# Patient Record
Sex: Male | Born: 1995 | Race: White | Hispanic: No | Marital: Single | Smoking: Current some day smoker
Health system: Southern US, Community
[De-identification: ages and names within clinical notes are randomized; demographics above are authoritative.]

## PROBLEM LIST (undated history)

## (undated) DIAGNOSIS — I82409 Acute embolism and thrombosis of unspecified deep veins of unspecified lower extremity: Secondary | ICD-10-CM

## (undated) HISTORY — DX: Acute embolism and thrombosis of unspecified deep veins of unspecified lower extremity: I82.409

---
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
## 2018-05-16 ENCOUNTER — Emergency Department (HOSPITAL_COMMUNITY)
Admission: EM | Admit: 2018-05-16 | Discharge: 2018-05-16 | Disposition: A | Discharge status: Home / Self Care | Payer: Self-pay | Attending: Emergency Medicine | Admitting: Emergency Medicine

## 2018-05-16 ENCOUNTER — Encounter (HOSPITAL_COMMUNITY): Payer: Self-pay

## 2018-05-16 ENCOUNTER — Emergency Department (HOSPITAL_COMMUNITY): Payer: Self-pay

## 2018-05-16 ENCOUNTER — Other Ambulatory Visit: Payer: Self-pay

## 2018-05-16 DIAGNOSIS — Y929 Unspecified place or not applicable: Secondary | ICD-10-CM | POA: Insufficient documentation

## 2018-05-16 DIAGNOSIS — F17290 Nicotine dependence, other tobacco product, uncomplicated: Secondary | ICD-10-CM | POA: Insufficient documentation

## 2018-05-16 DIAGNOSIS — Y939 Activity, unspecified: Secondary | ICD-10-CM | POA: Insufficient documentation

## 2018-05-16 DIAGNOSIS — S52614A Nondisplaced fracture of right ulna styloid process, initial encounter for closed fracture: Secondary | ICD-10-CM | POA: Insufficient documentation

## 2018-05-16 DIAGNOSIS — Y999 Unspecified external cause status: Secondary | ICD-10-CM | POA: Insufficient documentation

## 2018-05-16 DIAGNOSIS — W230XXA Caught, crushed, jammed, or pinched between moving objects, initial encounter: Secondary | ICD-10-CM | POA: Insufficient documentation

## 2018-05-16 MED ORDER — NAPROXEN 500 MG PO TABS: 500.0000 mg | ORAL_TABLET | Freq: Two times a day (BID) | ORAL | 0 refills | Status: DC

## 2018-05-16 NOTE — ED Triage Notes (Signed)
Patient is AOx4 and ambulatory. Patient comes from home accompanied by mother. Patient accidentally shut door on right hand / wrist about 14 days ago, however pain has stayed somewhat the same. Patient attempted to use ice and ibproufuen for the 14 days however no decrease in pain or inflammation.

## 2018-05-16 NOTE — ED Provider Notes (Signed)
[facility]-EMERGENCY DEPT Provider Note   CSN: [id number] Arrival date & time: 05/16/18  1615     History   Chief Complaint Chief Complaint  Patient presents with  . Right Wrist Injury    HPI Ryan Drake is a 23 y.o. male with no significant past medical history who presents for evaluation of wrist pain.  Patient states he accidentally shut his wrist in a door approximately 2 weeks ago.  Patient states he has had intermittent pain to his right wrist since the incident.  Patient has been using ice and ibuprofen however states he still has intermittent pain.  Patient states he has pain when he goes to pronate and supinate at the wrist.  Patient has no pain when he flexes and extends at the right wrist.  Denies fever, chills, nausea, vomiting, numbness or tingling in his extremities, decreased range of motion, redness, warmth to his extremities.  Patient states he will have intermittent swelling to his right wrist.  Denies any additional aggravating or alleviating factors.  Denies history of IV drug use.  History obtained from patient.  No interpreter was used.  HPI  History reviewed. No pertinent past medical history.  There are no active problems to display for this patient.   History reviewed. No pertinent surgical history.      Home Medications    Prior to Admission medications   Medication Sig Start Date End Date Taking? Authorizing Provider  naproxen (NAPROSYN) 500 MG tablet Take 1 tablet (500 mg total) by mouth 2 (two) times daily. 05/16/18   Dann Ventress A, PA-C    Family History No family history on file.  Social History Social History   Tobacco Use  . Smoking status: Current Some Day Smoker    Types: Cigars  . Smokeless tobacco: Never Used  . Tobacco comment: 1-3 every other day  Substance Use Topics  . Alcohol use: No  . Drug use: Never     Allergies   Patient has no known allergies.   Review of Systems Review of Systems   Constitutional: Negative.   HENT: Negative.   Respiratory: Negative.   Cardiovascular: Negative.   Gastrointestinal: Negative.   Genitourinary: Negative.   Musculoskeletal:       Right wrist pain.  Skin: Negative.   All other systems reviewed and are negative.    Physical Exam Updated Vital Signs BP (!) 145/94 (BP Location: Left Arm)   Pulse 72   Temp 99 F (37.2 C) (Oral)   Resp 17   Ht 5\' 10"  (1.778 m)   Wt 108.4 kg   SpO2 98%   BMI 34.30 kg/m   Physical Exam Vitals signs and nursing note reviewed.  Constitutional:      General: He is not in acute distress.    Appearance: He is well-developed. He is not diaphoretic.  HENT:     Head: Atraumatic.     Nose: Nose normal.     Mouth/Throat:     Mouth: Mucous membranes are moist.     Pharynx: Oropharynx is clear.  Eyes:     Pupils: Pupils are equal, round, and reactive to light.  Neck:     Musculoskeletal: Normal range of motion and neck supple.  Cardiovascular:     Rate and Rhythm: Normal rate and regular rhythm.     Pulses: Normal pulses.     Heart sounds: Normal heart sounds. No murmur.  Pulmonary:     Effort: Pulmonary effort is normal. No  respiratory distress.     Breath sounds: Normal breath sounds.  Abdominal:     General: There is no distension.     Palpations: Abdomen is soft.     Tenderness: There is no abdominal tenderness.  Musculoskeletal: Normal range of motion.     Comments: Full range of motion bilateral upper extremities with flexion, extension.  Patient with tenderness with supination to right wrist.  No tenderness with pronation.  No bony tenderness.  No tenderness to scaphoid.  Full and equal grip strength.  Skin:    General: Skin is warm and dry.     Comments: No edema, erythema, ecchymosis or warmth to bilateral upper extremities.  Neurological:     Mental Status: He is alert.     Comments: Intact sensation to bilateral upper extremities.  5/5 strength to bilateral upper extremities.       ED Treatments / Results  Labs (all labs ordered are listed, but only abnormal results are displayed) Labs Reviewed - No data to display  EKG None  Radiology Dg Wrist Complete Right  Result Date: 05/16/2018 CLINICAL DATA:  Persistent pain after accidentally shadowing right hand/wrist in the car door 2 weeks ago. EXAM: RIGHT HAND - COMPLETE 3+ VIEW; RIGHT WRIST - COMPLETE 3+ VIEW COMPARISON:  Right wrist x-rays dated February 10, 2013. FINDINGS: Small avulsion fracture of the ulnar styloid. No additional fracture. No dislocation. Joint spaces are preserved. Bone mineralization is normal. Soft tissues are unremarkable. IMPRESSION: 1. Small avulsion fracture of the ulnar styloid. Electronically Signed   By: Obie Dredge M.D.   On: 05/16/2018 17:26   Dg Hand Complete Right  Result Date: 05/16/2018 CLINICAL DATA:  Persistent pain after accidentally shadowing right hand/wrist in the car door 2 weeks ago. EXAM: RIGHT HAND - COMPLETE 3+ VIEW; RIGHT WRIST - COMPLETE 3+ VIEW COMPARISON:  Right wrist x-rays dated February 10, 2013. FINDINGS: Small avulsion fracture of the ulnar styloid. No additional fracture. No dislocation. Joint spaces are preserved. Bone mineralization is normal. Soft tissues are unremarkable. IMPRESSION: 1. Small avulsion fracture of the ulnar styloid. Electronically Signed   By: Obie Dredge M.D.   On: 05/16/2018 17:26    Procedures Procedures (including critical care time)  Medications Ordered in ED Medications - No data to display   Initial Impression / Assessment and Plan / ED Course  I have reviewed the triage vital signs and the nursing notes.  Pertinent labs & imaging results that were available during my care of the patient were reviewed by me and considered in my medical decision making (see chart for details).  23 year old male who appears otherwise well presents for evaluation of right wrist injury.  Afebrile, nonseptic, non-ill-appearing. Symptom  onset 2 weeks ago.  Has intermittent pain and swelling to his right wrist.  Normal musculoskeletal exam.  No bony tenderness.  Neurovascularly intact.  2+ radial pulses.  No tenderness to scaphoid.  Patient with mild tenderness with supination of right wrist.  No IV drug use.  No overlying skin changes to suggest cellulitis such as warmth, erythema, swelling.  Low suspicion for septic joint, hemarthrosis, gout.  Upper extremity compartments are soft.  Low suspicion for compartment syndrome. Will obtain x-ray to rule out fracture or dislocation and reevaluate.  X-ray shows small avulsion fracture of ulna styloid.  Will place in splint and have patient follow-up with orthopedics.  Patient requesting removable velcro splint.  RICE for symptomatic management.  Patient is hemodynamically stable and appropriate for DC home at  this time.  I discussed return precautions.  Patient voiced understanding and is agreeable for follow-up.  Patient to follow-up with orthopedics for reevaluation.  Low suspicion for emergent pathology causing patient's symptoms at this time.      Final Clinical Impressions(s) / ED Diagnoses   Final diagnoses:  Closed nondisplaced fracture of styloid process of right ulna, initial encounter    ED Discharge Orders         Ordered    naproxen (NAPROSYN) 500 MG tablet  2 times daily     05/16/18 1749           Tex Conroy A, PA-C 05/16/18 1756    Geoffery Lyonselo, [place], MD 05/16/18 2251

## 2018-05-16 NOTE — Discharge Instructions (Addendum)
Your evaluated today for wrist pain.  You do have a small fracture to your ulnar styloid, which is on the right side of your wrist.  I placed you in a removable splint.  Please try to keep this on the majority of the time.  You may also ice, rest as well as take ibuprofen for your symptoms.  Please follow-up with orthopedics for reevaluation.  Their information is listed on your discharge paperwork.  You will need to call them to schedule an appointment.

## 2019-01-30 ENCOUNTER — Emergency Department (HOSPITAL_BASED_OUTPATIENT_CLINIC_OR_DEPARTMENT_OTHER): Payer: Self-pay

## 2019-01-30 ENCOUNTER — Encounter (HOSPITAL_COMMUNITY): Payer: Self-pay

## 2019-01-30 ENCOUNTER — Other Ambulatory Visit: Payer: Self-pay

## 2019-01-30 ENCOUNTER — Emergency Department (HOSPITAL_COMMUNITY)
Admission: EM | Admit: 2019-01-30 | Discharge: 2019-01-30 | Disposition: A | Discharge status: Home / Self Care | Payer: Self-pay | Attending: Emergency Medicine | Admitting: Emergency Medicine

## 2019-01-30 DIAGNOSIS — M79604 Pain in right leg: Secondary | ICD-10-CM

## 2019-01-30 DIAGNOSIS — M79609 Pain in unspecified limb: Secondary | ICD-10-CM

## 2019-01-30 DIAGNOSIS — F17290 Nicotine dependence, other tobacco product, uncomplicated: Secondary | ICD-10-CM | POA: Insufficient documentation

## 2019-01-30 DIAGNOSIS — M7989 Other specified soft tissue disorders: Secondary | ICD-10-CM

## 2019-01-30 DIAGNOSIS — I82401 Acute embolism and thrombosis of unspecified deep veins of right lower extremity: Secondary | ICD-10-CM | POA: Insufficient documentation

## 2019-01-30 LAB — BASIC METABOLIC PANEL
Anion gap: 11 (ref 5–15)
BUN: 13 mg/dL (ref 6–20)
CO2: 24 mmol/L (ref 22–32)
Calcium: 9.9 mg/dL (ref 8.9–10.3)
Chloride: 103 mmol/L (ref 98–111)
Creatinine, Ser: 1.18 mg/dL (ref 0.61–1.24)
GFR calc Af Amer: >60 mL/min (ref >60)
GFR calc non Af Amer: >60 mL/min (ref >60)
Glucose, Bld: 95 mg/dL (ref 70–99)
Potassium: 4.1 mmol/L (ref 3.5–5.1)
Sodium: 138 mmol/L (ref 135–145)

## 2019-01-30 LAB — CBC WITH DIFFERENTIAL/PLATELET
Abs Immature Granulocytes: 0.08 10*3/uL — ABNORMAL HIGH (ref 0.00–0.07)
Basophils Absolute: 0.1 10*3/uL (ref 0.0–0.1)
Basophils Relative: 1 %
Eosinophils Absolute: 0.5 10*3/uL (ref 0.0–0.5)
Eosinophils Relative: 3 %
HCT: 45.4 % (ref 39.0–52.0)
Hemoglobin: 15.4 g/dL (ref 13.0–17.0)
Immature Granulocytes: 1 %
Lymphocytes Relative: 21 %
Lymphs Abs: 2.9 10*3/uL (ref 0.7–4.0)
MCH: 30.2 pg (ref 26.0–34.0)
MCHC: 33.9 g/dL (ref 30.0–36.0)
MCV: 89 fL (ref 80.0–100.0)
Monocytes Absolute: 1.3 10*3/uL — ABNORMAL HIGH (ref 0.1–1.0)
Monocytes Relative: 10 %
Neutro Abs: 8.9 10*3/uL — ABNORMAL HIGH (ref 1.7–7.7)
Neutrophils Relative %: 64 %
Platelets: 288 10*3/uL (ref 150–400)
RBC: 5.1 MIL/uL (ref 4.22–5.81)
RDW: 12.3 % (ref 11.5–15.5)
WBC: 13.7 10*3/uL — ABNORMAL HIGH (ref 4.0–10.5)
nRBC: 0 % (ref 0.0–0.2)

## 2019-01-30 MED ORDER — RIVAROXABAN 15 MG PO TABS
15.0000 mg | ORAL_TABLET | Freq: Once | ORAL | Status: AC
  Administered 2019-01-30: 15 mg via ORAL
  Filled 2019-01-30: qty 1

## 2019-01-30 MED ORDER — RIVAROXABAN (XARELTO) EDUCATION KIT FOR DVT/PE PATIENTS
PACK | Freq: Once | Status: AC
  Administered 2019-01-30: 20:00:00
  Filled 2019-01-30: qty 1

## 2019-01-30 MED ORDER — RIVAROXABAN (XARELTO) VTE STARTER PACK (15 & 20 MG): ORAL_TABLET | ORAL | 0 refills | Status: AC

## 2019-01-30 NOTE — ED Triage Notes (Signed)
Patient c/o right leg pain x 2 weeks. Patient states the pain started at his right ankle then the calf and is now the upper leg. Patient states he had leg swelling  For a few days, but now the swelling is gone.

## 2019-01-30 NOTE — Progress Notes (Signed)
RLE venous duplex       has been completed. Preliminary results can be found under CV proc through chart review. June Leap, BS, RDMS, RVT   Positive results given to Margaux PA

## 2019-01-30 NOTE — ED Notes (Signed)
No respiratory or acute distress noted alert and oriented x 3 call light in reach water given visitor at bedside.

## 2019-01-30 NOTE — Discharge Instructions (Addendum)
You were found to have a DVT today. We have given you a dose of blood thinners in the ED and have prescribed a months worth and sent to your pharmacy. It is very important that you establish care with a PCP outpatient to have further management as you will need a new prescription every month. You may follow up with [facility]. Attached to your discharge instructions is also a number you can call to find a PCP in your area.   Return to the ED immediately for any new or worsening symptoms including chest pain or shortness of breath. Below is information on the medication you will be taking.   Information on my medicine - XARELTO (rivaroxaban)  This medication education was reviewed with me or my healthcare representative as part of my discharge preparation.  The pharmacist that spoke with me during my hospital stay was:  [place] PRESCRIBED FOR YOU? Xarelto was prescribed to treat blood clots that may have been found in the veins of your legs (deep vein thrombosis) or in your lungs (pulmonary embolism) and to reduce the risk of them occurring again.  WHAT DO YOU NEED TO KNOW ABOUT XARELTO? The starting dose is one 15 mg tablet taken TWICE daily with food for the FIRST 21 DAYS then on (enter date)  10/28  the dose is changed to one 20 mg tablet taken ONCE A DAY with your evening meal.  DO NOT stop taking Xarelto without talking to the health care provider who prescribed the medication.  Refill your prescription for 20 mg tablets before you run out.  After discharge, you should have regular check-up appointments with your healthcare provider that is prescribing your Xarelto.  In the future your dose may need to be changed if your kidney function changes by a significant amount.  WHAT DO YOU DO IF YOU MISS A DOSE? If you are taking Xarelto TWICE DAILY and you miss a dose, take it as soon as you remember. You may take two 15 mg tablets (total 30 mg) at the same time  then resume your regularly scheduled 15 mg twice daily the next day.  If you are taking Xarelto ONCE DAILY and you miss a dose, take it as soon as you remember on the same day then continue your regularly scheduled once daily regimen the next day. Do not take two doses of Xarelto at the same time.   IMPORTANT SAFETY INFORMATION Xarelto is a blood thinner medicine that can cause bleeding. You should call your healthcare provider right away if you experience any of the following: Bleeding from an injury or your nose that does not stop. Unusual colored urine (red or dark brown) or unusual colored stools (red or black). Unusual bruising for unknown reasons. A serious fall or if you hit your head (even if there is no bleeding).  Some medicines may interact with Xarelto and might increase your risk of bleeding while on Xarelto. To help avoid this, consult your healthcare provider or pharmacist prior to using any new prescription or non-prescription medications, including herbals, vitamins, non-steroidal anti-inflammatory drugs (NSAIDs) and supplements.  This website has more information on Xarelto: [email or web address]

## 2019-01-30 NOTE — ED Provider Notes (Signed)
[facility] DEPT Provider Note   CSN: [id number] Arrival date & time: 01/30/19  1235     History   Chief Complaint Chief Complaint  Patient presents with   Leg Pain    HPI Ryan Drake is a 23 y.o. male who presents to the ED today complaining of sudden onset, constant, achy, right calf pain x 2 weeks.  Reports he was sitting on the couch when he felt an immediate pain in his calf.  He states that it has slowly moved up and is now behind his right knee.  States he did notice some leg swelling that has since improved.  He has been taking ibuprofen and Tylenol for the pain.  History of DVT or PE.  Patient states that he does have a family history of DVT due to a gene disorder.  Recent prolonged travel or immobilization.  No hemoptysis.  No active malignancy.  Denies fever, chills, chest pains, shortness of breath, any other associated symptoms.  No injury to the leg prior to the pain beginning.       History reviewed. No pertinent past medical history.  There are no active problems to display for this patient.   History reviewed. No pertinent surgical history.      Home Medications    Prior to Admission medications   Medication Sig Start Date End Date Taking? Authorizing Provider  naproxen (NAPROSYN) 500 MG tablet Take 1 tablet (500 mg total) by mouth 2 (two) times daily. Patient not taking: Reported on 01/30/2019 05/16/18   Henderly, Britni A, PA-C  Rivaroxaban 15 & 20 MG TBPK Follow package directions: Take one 37m tablet by mouth twice a day. On day 22, switch to one 235mtablet once a day. Take with food. 01/30/19   VeEustaquio MaizePA-C    Family History Family History  Problem Relation Age of Onset   Healthy Mother    Healthy Father     Social History Social History   Tobacco Use   Smoking status: Current Some Day Smoker    Types: Cigars   Smokeless tobacco: Never Used   Tobacco comment: 1-3 every other day  Substance Use  Topics   Alcohol use: No   Drug use: Never     Allergies   Patient has no known allergies.   Review of Systems Review of Systems  Constitutional: Negative for chills and fever.  HENT: Negative for congestion.   Eyes: Negative for visual disturbance.  Respiratory: Negative for cough and shortness of breath.   Cardiovascular: Negative for chest pain.  Gastrointestinal: Negative for abdominal pain, nausea and vomiting.  Genitourinary: Negative for difficulty urinating.  Musculoskeletal: Positive for arthralgias.  Skin: Negative for color change.  Neurological: Negative for weakness and numbness.     Physical Exam Updated Vital Signs BP (!) 152/83 (BP Location: Right Arm)    Pulse (!) 108    Temp 98.5 F (36.9 C) (Oral)    Resp 16    Ht '5\' 11"'  (1.803 m)    Wt 119.2 kg    SpO2 98%    BMI 36.65 kg/m   Physical Exam Vitals signs and nursing note reviewed.  Constitutional:      Appearance: He is obese. He is not ill-appearing.  HENT:     Head: Normocephalic and atraumatic.  Eyes:     Conjunctiva/sclera: Conjunctivae normal.  Neck:     Musculoskeletal: Neck supple.  Cardiovascular:     Rate and Rhythm: Normal rate and  regular rhythm.     Pulses: Normal pulses.  Pulmonary:     Effort: Pulmonary effort is normal.     Breath sounds: Normal breath sounds. No wheezing, rhonchi or rales.  Abdominal:     Palpations: Abdomen is soft.     Tenderness: There is no abdominal tenderness. There is no guarding or rebound.  Musculoskeletal:     Comments: No obvious swelling to right leg compared to left. + TTP to popliteal area laterally. No tenderness to ankle, achilles tendon, or bony portion of the knee. ROM intact throughout hip, knee, and ankle. Strength 5/5 bilaterally. Sensation intact throughout. 2+ PT and DP pulses.   Skin:    General: Skin is warm and dry.  Neurological:     Mental Status: He is alert.      ED Treatments / Results  Labs (all labs ordered are listed,  but only abnormal results are displayed) Labs Reviewed  CBC WITH DIFFERENTIAL/PLATELET - Abnormal; Notable for the following components:      Result Value   WBC 13.7 (*)    Neutro Abs 8.9 (*)    Monocytes Absolute 1.3 (*)    Abs Immature Granulocytes 0.08 (*)    All other components within normal limits  BASIC METABOLIC PANEL    EKG None  Radiology Vas [place] Lower Extremity Venous (dvt) (only Mc & Wl 7a-7p)  Result Date: 01/30/2019  Lower Venous Study Indications: Pain, and Swelling.  Comparison Study: no prior Performing Technologist: June Leap RDMS, RVT  Examination Guidelines: A complete evaluation includes B-mode imaging, spectral Doppler, color Doppler, and power Doppler as needed of all accessible portions of each vessel. Bilateral testing is considered an integral part of a complete examination. Limited examinations for reoccurring indications may be performed as noted.  +---------+---------------+---------+-----------+----------+--------------+  RIGHT     Compressibility Phasicity Spontaneity Properties Thrombus Aging  +---------+---------------+---------+-----------+----------+--------------+  CFV       Full            Yes       Yes                                    +---------+---------------+---------+-----------+----------+--------------+  SFJ       Full                                                             +---------+---------------+---------+-----------+----------+--------------+  FV Prox   Full                                                             +---------+---------------+---------+-----------+----------+--------------+  FV Mid    Partial                   Yes                                    +---------+---------------+---------+-----------+----------+--------------+  FV Distal None                                                             +---------+---------------+---------+-----------+----------+--------------+  PFV       Full                                                              +---------+---------------+---------+-----------+----------+--------------+  POP       None            No        No                                     +---------+---------------+---------+-----------+----------+--------------+  PTV       None                                                             +---------+---------------+---------+-----------+----------+--------------+  PERO      None                                                             +---------+---------------+---------+-----------+----------+--------------+  Gastroc   None                                                             +---------+---------------+---------+-----------+----------+--------------+     Summary: Right: Findings consistent with acute deep vein thrombosis involving the right mid to distal femoral vein, right popliteal vein, right posterior tibial veins, right peroneal veins, and right gastrocnemius veins.  *See table(s) above for measurements and observations.    Preliminary     Procedures Procedures (including critical care time)  Medications Ordered in ED Medications  rivaroxaban [place] Reasons) Education Kit for DVT/PE patients ( Does not apply Given 01/30/19 2006)  Rivaroxaban (XARELTO) tablet 15 mg (15 mg Oral Given 01/30/19 2004)     Initial Impression / Assessment and Plan / ED Course  I have reviewed the triage vital signs and the nursing notes.  Pertinent labs & imaging results that were available during my care of the patient were reviewed by me and considered in my medical decision making (see chart for details).    23 year old male who presents to the ED today complaining of sudden onset right calf pain x2 weeks ago.  He does have family history of blood clots from a gene disorder.  He has not been tested for this gene.  No provoking risk factors today.  Patient denying any chest pain or shortness of breath.  Will obtain ultrasound at this time.  Was initially tachycardic on exam  at 108 but it has decreased while in the ER. Most recent pulse 84.   Ultrasound positive for DVT.  Will obtain baseline blood work at this time prior to starting Xarelto.  Given patient without any chest pain or  shortness of breath.  Do not feel he needs CTA at this time.    Lab work unremarkable today.  Have given first dose of Xarelto in the ED.  Pharmacist has spoken to patient about side effects. Stable for discharge at this time.    Final Clinical Impressions(s) / ED Diagnoses   Final diagnoses:  Right leg pain  Acute deep vein thrombosis (DVT) of right lower extremity, unspecified vein [facility])    ED Discharge Orders         Ordered    Rivaroxaban 15 & 20 MG TBPK     01/30/19 2011           Eustaquio Maize, PA-C 01/30/19 2141    Lucrezia Starch, MD 01/31/19 [phone]

## 2019-05-25 ENCOUNTER — Telehealth: Payer: Self-pay | Admitting: Hematology and Oncology

## 2019-05-25 NOTE — Telephone Encounter (Signed)
A new hem appt has been scheduled for the pt to see Dr. Leonides Schanz on 2/8 at 2pm. Pt aware to arrive 15 minutes early.

## 2019-06-03 NOTE — Progress Notes (Signed)
[facility] [phone]   Fax:([phone]  INITIAL CONSULT NOTE  Patient Care Team: Patient, No Pcp Per as PCP - General (General Practice)  Hematological/Oncological History  # Extensive Right Lower Extremity DVT #Family History of Coagulable State 1) 01/30/2019: patient presented to the ED with right calf pain x 2 weeks. LE US showed acute deep vein thrombosis involving the right mid to distal femoral vein, right popliteal vein, right posterior tibial veins, right peroneal veins, and right gastrocnemius veins. Started on Xarelto therapy.  2) 06/04/2019: establish care with Dr. Leonides Schanz   CHIEF COMPLAINTS/PURPOSE OF CONSULTATION:  Extensive Right Lower Extremity DVT  HISTORY OF PRESENTING ILLNESS:  Ryan Drake 24 y.o. male with medical history significant for a right lower extremity DVT who presents for evaluation and management.   On review of the previous records Ryan Drake presented to the emergency department on 01/30/2019 with 2 weeks of right calf pain. He underwent a lower extremity US which revealed acute deep vein thrombosis involving the right mid to distal femoral vein, right popliteal vein, right posterior tibial veins, right peroneal veins, and right gastrocnemius veins. He was started on Xarelto therapy and discharged. He was subsequently referred to hematology for hypercoagulation workup and anticoagulation management.   On exam today Ryan Drake notes that he feels well.  He reports that his symptoms of leg pain and swelling have dissipated in the 2 weeks following his starting of anticoagulation therapy.  He reports that he has not had any issues previously with blood clots in his past.  He does note that a maternal grandmother was noted to have "factor 4 something" and that she was of European descent.  He denied having any surgery, trauma, or prolonged travel prior to developing his blood clot.  He is currently a smoker but smokes less than 1  pack/day.  He does report that he was sitting at home at the time of the clot when he felt the pain in the back of his right calf.  He reports that he has no other relevant medical history.  On discussion of his Xarelto therapy he notes that he has not had any issues with bleeding, bruising, or dark stools.  He has not noticed any side effects since starting the medication.  Otherwise he has had no nausea, vomiting, diarrhea, fevers, chills, sweats.  A full 10 point ROS is listed below.  MEDICAL HISTORY:  Past Medical History:  Diagnosis Date   DVT (deep venous thrombosis) (HCC)     SURGICAL HISTORY: History reviewed. No pertinent surgical history.  SOCIAL HISTORY: Social History   Socioeconomic History   Marital status: Single    Spouse name: Not on file   Number of children: 0   Years of education: Not on file   Highest education level: Not on file  Occupational History   Not on file  Tobacco Use   Smoking status: Current Some Day Smoker    Types: Cigars   Smokeless tobacco: Never Used   Tobacco comment: 1-3 every other day  Substance and Sexual Activity   Alcohol use: No   Drug use: Never   Sexual activity: Yes    Birth control/protection: Condom  Other Topics Concern   Not on file  Social History Narrative   Not on file   Social Determinants of Health   Financial Resource Strain:    Difficulty of Paying Living Expenses: Not on file  Food Insecurity:    Worried About Running Out  of Food in the Last Year: Not on file   Ran Out of Food in the Last Year: Not on file  Transportation Needs:    Lack of Transportation (Medical): Not on file   Lack of Transportation (Non-Medical): Not on file  Physical Activity:    Days of Exercise per Week: Not on file   Minutes of Exercise per Session: Not on file  Stress:    Feeling of Stress : Not on file  Social Connections:    Frequency of Communication with Friends and Family: Not on file   Frequency  of Social Gatherings with Friends and Family: Not on file   Attends Religious Services: Not on file   Active Member of Clubs or Organizations: Not on file   Attends Banker Meetings: Not on file   Marital Status: Not on file  Intimate Partner Violence:    Fear of Current or Ex-Partner: Not on file   Emotionally Abused: Not on file   Physically Abused: Not on file   Sexually Abused: Not on file    FAMILY HISTORY: Family History  Problem Relation Age of Onset   Healthy Mother    Healthy Father     ALLERGIES:  has No Known Allergies.  MEDICATIONS:  Current Outpatient Medications  Medication Sig Dispense Refill   Rivaroxaban 15 & 20 MG TBPK Follow package directions: Take one 15mg  tablet by mouth twice a day. On day 22, switch to one 20mg  tablet once a day. Take with food. 51 each 0   No current facility-administered medications for this visit.    REVIEW OF SYSTEMS:   Constitutional: ( - ) fevers, ( - )  chills , ( - ) night sweats Eyes: ( - ) blurriness of vision, ( - ) double vision, ( - ) watery eyes Ears, nose, mouth, throat, and face: ( - ) mucositis, ( - ) sore throat Respiratory: ( - ) cough, ( - ) dyspnea, ( - ) wheezes Cardiovascular: ( - ) palpitation, ( - ) chest discomfort, ( - ) lower extremity swelling Gastrointestinal:  ( - ) nausea, ( - ) heartburn, ( - ) change in bowel habits Skin: ( - ) abnormal skin rashes Lymphatics: ( - ) new lymphadenopathy, ( - ) easy bruising Neurological: ( - ) numbness, ( - ) tingling, ( - ) new weaknesses Behavioral/Psych: ( - ) mood change, ( - ) new changes  All other systems were reviewed with the patient and are negative.  PHYSICAL EXAMINATION: ECOG PERFORMANCE STATUS: 0 - Asymptomatic  Vitals:   06/04/19 1341  BP: (!) 144/83  Pulse: 80  Resp: 20  Temp: 98 F (36.7 C)  SpO2: 100%   Filed Weights   06/04/19 1341  Weight: 289 lb 3.2 oz (131.2 kg)    GENERAL: well appearing young African  American male in NAD  SKIN: skin color, texture, turgor are normal, no rashes or significant lesions EYES: conjunctiva are pink and non-injected, sclera clear LUNGS: clear to auscultation and percussion with normal breathing effort HEART: regular rate & rhythm and no murmurs and no lower extremity edema ABDOMEN: soft, non-tender, non-distended, normal bowel sounds Musculoskeletal: no cyanosis of digits and no clubbing  PSYCH: alert & oriented x 3, fluent speech NEURO: no focal motor/sensory deficits  LABORATORY DATA:  I have reviewed the data as listed CBC Latest Ref Rng & Units 06/04/2019 01/30/2019  WBC 4.0 - 10.5 K/uL 8.8 13.7(H)  Hemoglobin 13.0 - 17.0 g/dL 08/02/2019 15.4  Hematocrit 39.0 - 52.0 % 46.7 45.4  Platelets 150 - 400 K/uL 307 288    CMP Latest Ref Rng & Units 06/04/2019 01/30/2019  Glucose 70 - 99 mg/dL 91 95  BUN 6 - 20 mg/dL 11 13  Creatinine 0.09 - 1.24 mg/dL 3.81 8.29  Sodium 937 - 145 mmol/L 142 138  Potassium 3.5 - 5.1 mmol/L 4.1 4.1  Chloride 98 - 111 mmol/L 106 103  CO2 22 - 32 mmol/L 28 24  Calcium 8.9 - 10.3 mg/dL 9.9 9.9  Total Protein 6.5 - 8.1 g/dL 7.0 -  Total Bilirubin 0.3 - 1.2 mg/dL 0.5 -  Alkaline Phos 38 - 126 U/L 81 -  AST 15 - 41 U/L 15 -  ALT 0 - 44 U/L 27 -   PATHOLOGY: None relevant to review.   RADIOGRAPHIC STUDIES: None relevant to review.  No results found.  ASSESSMENT & PLAN Ryan Drake 24 y.o. male with medical history significant for a right lower extremity DVT who presents for evaluation and management.  On examination today Ryan Drake has no signs or symptoms of residual DVT.  Additionally he has no signs concerning for pulmonary embolism.  He is tolerating his Xarelto therapy well without any bleeding, bruising, or dark stools.  He does report having a strong family history of clotting disorder with his maternal grandmother having factor V Leiden.  He personally has had no other blood clots.  Unfortunately after review of the history  around the time of the clot the patient had no clear provoking etiology.  As such the recommendation is for lifelong anticoagulation in the absence of any contraindications.  Today we discussed with Ryan Drake lifelong anticoagulation therapy and how he would like to proceed with his treatment.  I note that we could continue his Xarelto for an additional 3 months time (for a total of 17-months) and then consider decreasing to a maintenance dose of 10 mg.  Additionally today we will do a hypercoagulation work-up to include factor V Leiden, prothrombin gene mutation, and antiphospholipid antibodies.  We will hold on lupus anticoagulant panel as this would likely be a false positive in the setting of the patient's Xarelto use.  Ryan Drake voiced his understanding of our findings and was agreeable to return for further discussion in 3 months time.  #Extensive Right Lower Extremity DVT #Family History of Hypercoagulable State -- today will order CBC, CMP for baseline labs to assure no bleeding and good renal/hepatic function -- hypercoagulation studies with FVL,Prothrombin gene mutation, and APS antibodies (hold on lupus anticoagulant panel given patient is on Xarelto). Of note, only APS positivity would change our management strategy Dewayne Hatch Intern Med. 2019 Nov 19;171(10):[id number]) -- given the unprovoked nature of this patient's DVT would recommend indefinite anticoagulation.  --RTC in 3 months to discuss decreased Xarelto dosage to 10mg  daily as a maintenance dose.   Orders Placed This Encounter  Procedures   CBC with Differential (Cancer Center Only)    Standing Status:   Future    Number of Occurrences:   1    Standing Expiration Date:   06/03/2020   CMP (Cancer Center only)    Standing Status:   Future    Number of Occurrences:   1    Standing Expiration Date:   06/03/2020   Beta-2-glycoprotein i abs, IgG/M/A    Standing Status:   Future    Number of Occurrences:   1    Standing Expiration  Date:   06/03/2020  Cardiolipin antibodies, IgG, IgM, IgA*    Standing Status:   Future    Number of Occurrences:   1    Standing Expiration Date:   06/03/2020   Factor 5 Leiden*    Standing Status:   Future    Number of Occurrences:   1    Standing Expiration Date:   06/03/2020   Prothrombin gene mutation*    Standing Status:   Future    Number of Occurrences:   1    Standing Expiration Date:   06/03/2020   All questions were answered. The patient knows to call the clinic with any problems, questions or concerns.  A total of more than 45 minutes were spent on this encounter and over half of that time was spent on counseling and coordination of care as outlined above.   Ledell Peoples, MD Department of Hematology/Oncology [place] [facility] Phone: [phone] Pager: [phone] Email: [email or web address]  06/04/2019 5:32 PM   Literature Support:   Lum Keas, Sez-Comet L, Prez-Conesa M, Vidal X, Riera-Mestre A, Castro-Salom A, Cuquet-Pedragosa J, Ortiz-Santamaria V, Mauri-Plana M, Sol C, Corts-Hernndez J. Rivaroxaban Versus Vitamin K Antagonist in Antiphospholipid Syndrome: A Randomized Noninferiority Trial. Ann Intern Med. 2019 Nov 19;171(10):685-694. doi: 68.0321/Y24-8250. Epub 2019 Oct 15. PMID: 03704888. --Rivaroxaban did not show noninferiority to dose-adjusted VKAs for thrombotic APS and, in fact, showed a non-statistically significant near doubling of the risk for recurrent thrombosis.

## 2019-06-04 ENCOUNTER — Encounter: Payer: Self-pay | Admitting: Hematology and Oncology

## 2019-06-04 ENCOUNTER — Inpatient Hospital Stay: Payer: Self-pay | Attending: Hematology and Oncology | Admitting: Hematology and Oncology

## 2019-06-04 ENCOUNTER — Inpatient Hospital Stay: Payer: Self-pay

## 2019-06-04 ENCOUNTER — Other Ambulatory Visit: Payer: Self-pay

## 2019-06-04 VITALS — BP 144/83 | HR 80 | Temp 98.0°F | Resp 20 | Ht 71.0 in | Wt 289.2 lb

## 2019-06-04 DIAGNOSIS — Z86718 Personal history of other venous thrombosis and embolism: Secondary | ICD-10-CM | POA: Insufficient documentation

## 2019-06-04 DIAGNOSIS — I82461 Acute embolism and thrombosis of right calf muscular vein: Secondary | ICD-10-CM

## 2019-06-04 DIAGNOSIS — F17210 Nicotine dependence, cigarettes, uncomplicated: Secondary | ICD-10-CM | POA: Insufficient documentation

## 2019-06-04 DIAGNOSIS — Z7901 Long term (current) use of anticoagulants: Secondary | ICD-10-CM | POA: Insufficient documentation

## 2019-06-04 DIAGNOSIS — M79661 Pain in right lower leg: Secondary | ICD-10-CM | POA: Insufficient documentation

## 2019-06-04 LAB — CBC WITH DIFFERENTIAL (CANCER CENTER ONLY)
Abs Immature Granulocytes: 0.05 10*3/uL (ref 0.00–0.07)
Basophils Absolute: 0.1 10*3/uL (ref 0.0–0.1)
Basophils Relative: 1 %
Eosinophils Absolute: 0.2 10*3/uL (ref 0.0–0.5)
Eosinophils Relative: 2 %
HCT: 46.7 % (ref 39.0–52.0)
Hemoglobin: 15.8 g/dL (ref 13.0–17.0)
Immature Granulocytes: 1 %
Lymphocytes Relative: 20 %
Lymphs Abs: 1.8 10*3/uL (ref 0.7–4.0)
MCH: 29.5 pg (ref 26.0–34.0)
MCHC: 33.8 g/dL (ref 30.0–36.0)
MCV: 87.1 fL (ref 80.0–100.0)
Monocytes Absolute: 0.6 10*3/uL (ref 0.1–1.0)
Monocytes Relative: 7 %
Neutro Abs: 6.2 10*3/uL (ref 1.7–7.7)
Neutrophils Relative %: 69 %
Platelet Count: 307 10*3/uL (ref 150–400)
RBC: 5.36 MIL/uL (ref 4.22–5.81)
RDW: 13.2 % (ref 11.5–15.5)
WBC Count: 8.8 10*3/uL (ref 4.0–10.5)
nRBC: 0 % (ref 0.0–0.2)

## 2019-06-04 LAB — CMP (CANCER CENTER ONLY)
ALT: 27 U/L (ref 0–44)
AST: 15 U/L (ref 15–41)
Albumin: 4.2 g/dL (ref 3.5–5.0)
Alkaline Phosphatase: 81 U/L (ref 38–126)
Anion gap: 8 (ref 5–15)
BUN: 11 mg/dL (ref 6–20)
CO2: 28 mmol/L (ref 22–32)
Calcium: 9.9 mg/dL (ref 8.9–10.3)
Chloride: 106 mmol/L (ref 98–111)
Creatinine: 1.22 mg/dL (ref 0.61–1.24)
GFR, Est AFR Am: >60 mL/min (ref >60)
GFR, Estimated: >60 mL/min (ref >60)
Glucose, Bld: 91 mg/dL (ref 70–99)
Potassium: 4.1 mmol/L (ref 3.5–5.1)
Sodium: 142 mmol/L (ref 135–145)
Total Bilirubin: 0.5 mg/dL (ref 0.3–1.2)
Total Protein: 7 g/dL (ref 6.5–8.1)

## 2019-06-05 ENCOUNTER — Telehealth: Payer: Self-pay | Admitting: Hematology and Oncology

## 2019-06-05 NOTE — Telephone Encounter (Signed)
Scheduled per los. Called and spoke with patient. Confirmed appt 

## 2019-06-06 LAB — BETA-2-GLYCOPROTEIN I ABS, IGG/M/A
Beta-2 Glyco I IgG: <9 GPI IgG units (ref 0–20)
Beta-2-Glycoprotein I IgA: <9 GPI IgA units (ref 0–25)
Beta-2-Glycoprotein I IgM: <9 GPI IgM units (ref 0–32)

## 2019-06-07 LAB — CARDIOLIPIN ANTIBODIES, IGG, IGM, IGA
Anticardiolipin IgA: <9 APL U/mL (ref 0–11)
Anticardiolipin IgG: <9 GPL U/mL (ref 0–14)
Anticardiolipin IgM: <9 MPL U/mL (ref 0–12)

## 2019-06-08 LAB — PROTHROMBIN GENE MUTATION

## 2019-06-11 LAB — FACTOR 5 LEIDEN

## 2019-06-13 ENCOUNTER — Telehealth: Payer: Self-pay

## 2019-06-13 NOTE — Telephone Encounter (Signed)
-----   Message from Kyra Searles, RN sent at 06/13/2019  9:53 AM EST -----  ----- Message ----- From: Jaci Standard, MD Sent: 06/13/2019   8:33 AM EST To: Kyra Searles, RN  Please call Mr. 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 Our recommendation is for him to continue his Xarelto and we will see him back in 3 months time to discuss lowering to maintenance dose.   Azucena Freed  ----- Message ----- From: Leory Plowman, Lab In [place] Sent: 06/04/2019   3:09 PM EST To: Jaci Standard, MD

## 2019-06-13 NOTE — Telephone Encounter (Signed)
TCT patient regarding his test results but no response. Left VM message for patient to call nurse back.

## 2019-06-18 NOTE — Telephone Encounter (Signed)
TCT patient regarding lab results. No answer but was able to leave vm message for pt to call back at his convenience @ [phone]

## 2019-08-30 NOTE — Progress Notes (Deleted)
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 LE US showed acute deep vein thrombosis involving the right mid to distal femoral vein, right popliteal vein, right posterior tibial veins, right peroneal veins, and right gastrocnemius veins. Started on Xarelto therapy.  2) 06/04/2019: establish care with Dr. Leonides Schanz. 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 male with medical history significant for unprovoked right lower extremity DVT who presents for a follow up visit. The patient's last visit was on 06/04/2019 at which time he established care. In the interim since the last visit ***  On exam today ***  MEDICAL HISTORY:  Past Medical History:  Diagnosis Date  . DVT (deep venous thrombosis) (HCC)     SURGICAL HISTORY: No past surgical history on file.    ALLERGIES:  has No Known Allergies.  MEDICATIONS:  Current Outpatient Medications  Medication Sig Dispense Refill  . Rivaroxaban 15 & 20 MG TBPK Follow package directions: Take one 15mg  tablet by mouth twice a day. On day 22, switch to one 20mg  tablet once a day. Take with food. 51 each 0   No current facility-administered medications for this visit.    REVIEW OF SYSTEMS:   Constitutional: ( - ) fevers, ( - )  chills , ( - ) night sweats Eyes: ( - ) blurriness of vision, ( - ) double vision, ( - ) watery eyes Ears, nose, mouth, throat, and face: ( - ) mucositis, ( - ) sore throat Respiratory: ( - ) cough, ( - ) dyspnea, ( - ) wheezes Cardiovascular: ( - ) palpitation, ( - ) chest discomfort, ( - ) lower extremity  swelling Gastrointestinal:  ( - ) nausea, ( - ) heartburn, ( - ) change in bowel habits Skin: ( - ) abnormal skin rashes Lymphatics: ( - ) new lymphadenopathy, ( - ) easy bruising Neurological: ( - ) numbness, ( - ) tingling, ( - ) new weaknesses Behavioral/Psych: ( - ) mood change, ( - ) new changes  All other systems were reviewed with the patient and are negative.  PHYSICAL EXAMINATION: ECOG PERFORMANCE STATUS: {CHL ONC ECOG PS:[phone]}  There were no vitals filed for this visit. There were no vitals filed for this visit.  GENERAL: alert, no distress and comfortable SKIN: skin color, texture, turgor are normal, no rashes or significant lesions EYES: conjunctiva are pink and non-injected, sclera clear OROPHARYNX: no exudate, no erythema; lips, buccal mucosa, and tongue normal  NECK: supple, non-tender LYMPH:  no palpable lymphadenopathy in the cervical, axillary or inguinal LUNGS: clear to auscultation and percussion with normal breathing effort HEART: regular rate & rhythm and no murmurs and no lower extremity edema ABDOMEN: soft, non-tender, non-distended, normal bowel sounds Musculoskeletal: no cyanosis of digits and no clubbing  PSYCH: alert & oriented x 3, fluent speech NEURO: no focal motor/sensory deficits  LABORATORY DATA:  I have reviewed the data as listed CBC Latest Ref Rng & Units 06/04/2019 01/30/2019  WBC 4.0 - 10.5 K/uL 8.8 13.7(H)  Hemoglobin 13.0 - 17.0 g/dL 08/02/2019 04/01/2019  Hematocrit 73.4 - 52.0 % 46.7 45.4  Platelets 150 - 400 K/uL  307 288    CMP Latest Ref Rng & Units 06/04/2019 01/30/2019  Glucose 70 - 99 mg/dL 91 95  BUN 6 - 20 mg/dL 11 13  Creatinine 0.61 - 1.24 mg/dL 1.22 1.18  Sodium 135 - 145 mmol/L 142 138  Potassium 3.5 - 5.1 mmol/L 4.1 4.1  Chloride 98 - 111 mmol/L 106 103  CO2 22 - 32 mmol/L 28 24  Calcium 8.9 - 10.3 mg/dL 9.9 9.9  Total Protein 6.5 - 8.1 g/dL 7.0 -  Total Bilirubin 0.3 - 1.2 mg/dL 0.5 -  Alkaline Phos 38 - 126 U/L 81 -  AST 15  - 41 U/L 15 -  ALT 0 - 44 U/L 27 -    BLOOD FILM: *** Review of the peripheral blood smear showed normal appearing white cells with neutrophils that were appropriately lobated and granulated. There was no predominance of bi-lobed or hyper-segmented neutrophils appreciated. No Dohle bodies were noted. There was no left shifting, immature forms or blasts noted. Lymphocytes remain normal in size without any predominance of large granular lymphocytes. Red cells show no anisopoikilocytosis, macrocytes , microcytes or polychromasia. There were no schistocytes, target cells, echinocytes, acanthocytes, dacrocytes, or stomatocytes.There was no rouleaux formation, nucleated red cells, or intra-cellular inclusions noted. The platelets are normal in size, shape, and color without any clumping evident.  RADIOGRAPHIC STUDIES: I have personally reviewed the radiological images as listed and agreed with the findings in the report. No results found.  ASSESSMENT & PLAN Ryan Drake 24 y.o. male with medical history significant for unprovoked right lower extremity DVT who presents for a follow up visit. 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 Of note, only APS positivity would change our management strategy Lelon Frohlich Intern Med. 2019 Nov 19;171(10):685-694) -- given the unprovoked nature of this patient's DVT would recommend indefinite anticoagulation.  --RTC in 3 months to discuss decreased Xarelto dosage to 10mg  daily as a maintenance dose.   No orders of the defined types were placed in this encounter.   All questions were answered. The patient knows to call the clinic with  any problems, questions or concerns.  A total of more than {CHL ONC TIME VISIT - ALPFX:[id number]} were spent on this encounter and over half of that time was spent on counseling and coordination of care as outlined above.   Ledell Peoples, MD Department of Hematology/Oncology [place] at [facility] Phone: [phone] Pager: [phone] Email: [email or web address]  08/30/2019 11:36 AM

## 2019-08-31 ENCOUNTER — Inpatient Hospital Stay: Payer: Self-pay | Attending: Hematology and Oncology | Admitting: Hematology and Oncology

## 2019-08-31 ENCOUNTER — Inpatient Hospital Stay: Payer: Self-pay

## 2020-06-03 IMAGING — DX DG WRIST COMPLETE 3+V*R*
4 series · 4 of 4 positions shown · non-contrast
Comparison: Right wrist x-rays dated February 10, 2013.

CLINICAL DATA: Persistent pain after accidentally shadowing right
hand/wrist in the car door 2 weeks ago.

EXAM:
RIGHT HAND - COMPLETE 3+ VIEW; RIGHT WRIST - COMPLETE 3+ VIEW

[wrist ap (1 of 2)]
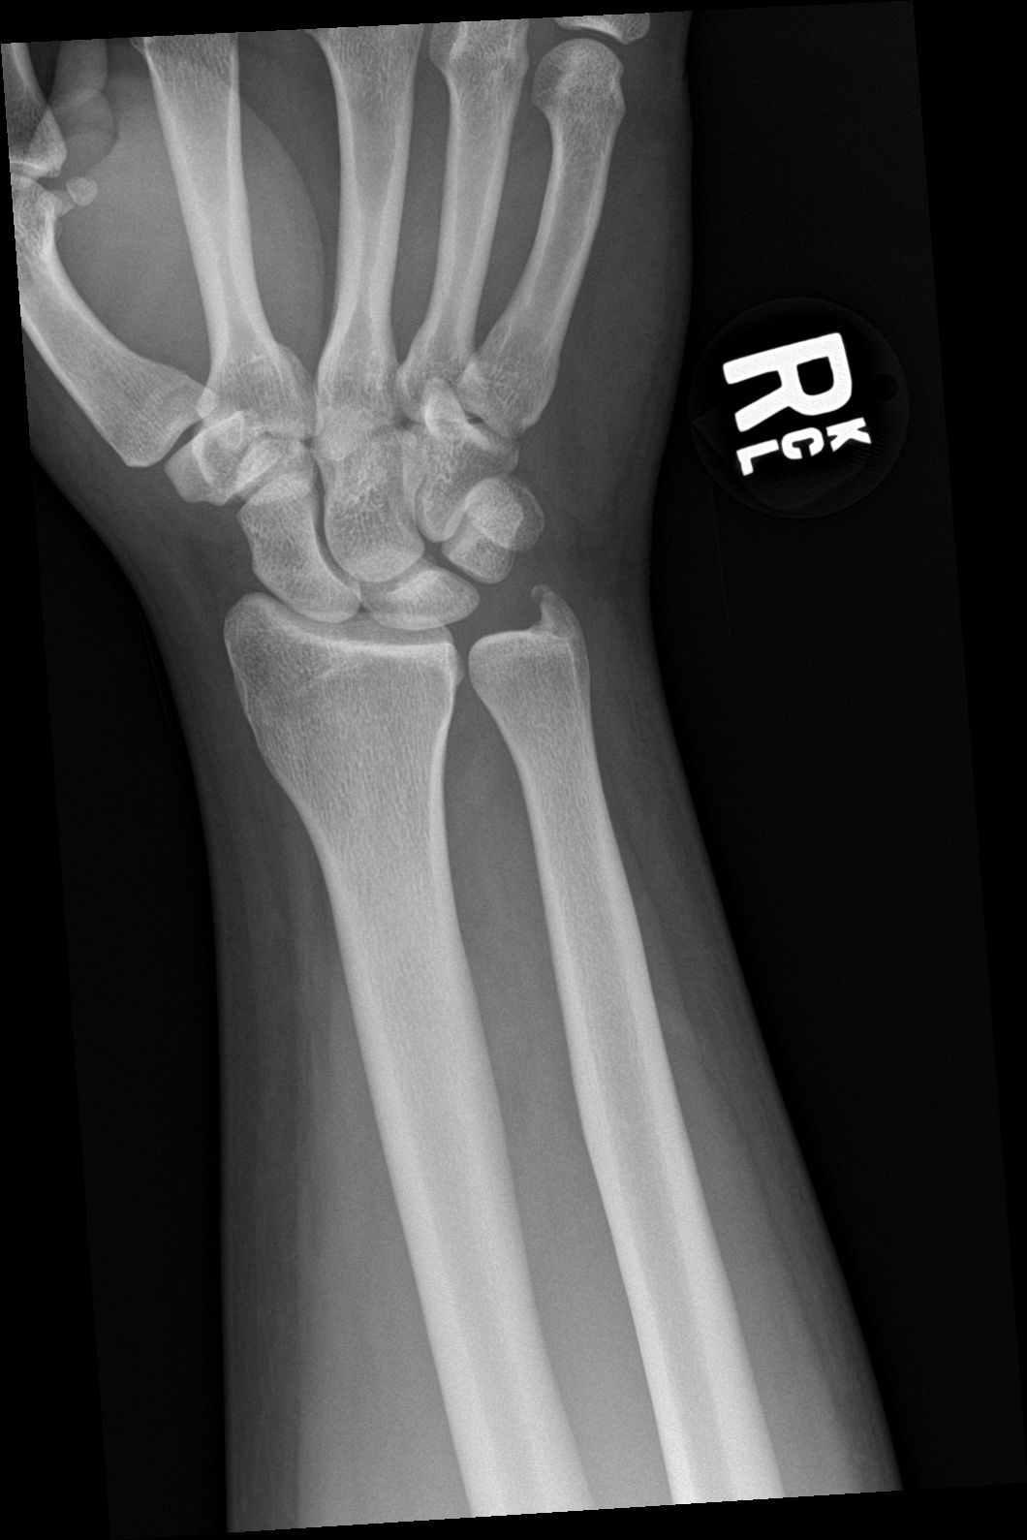

[wrist obl]
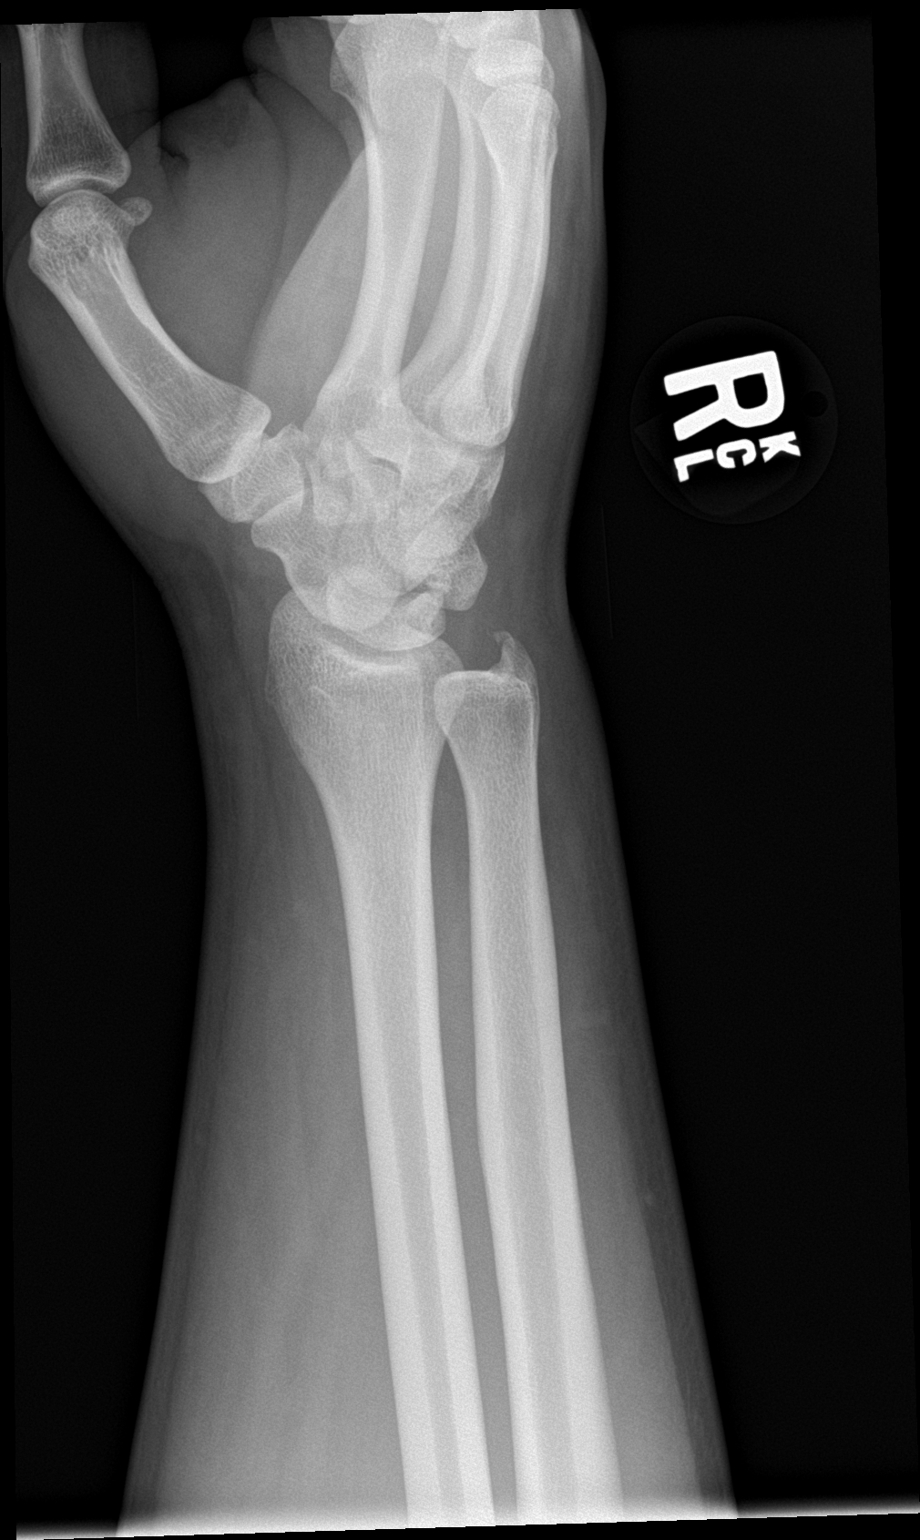

[wrist lat]
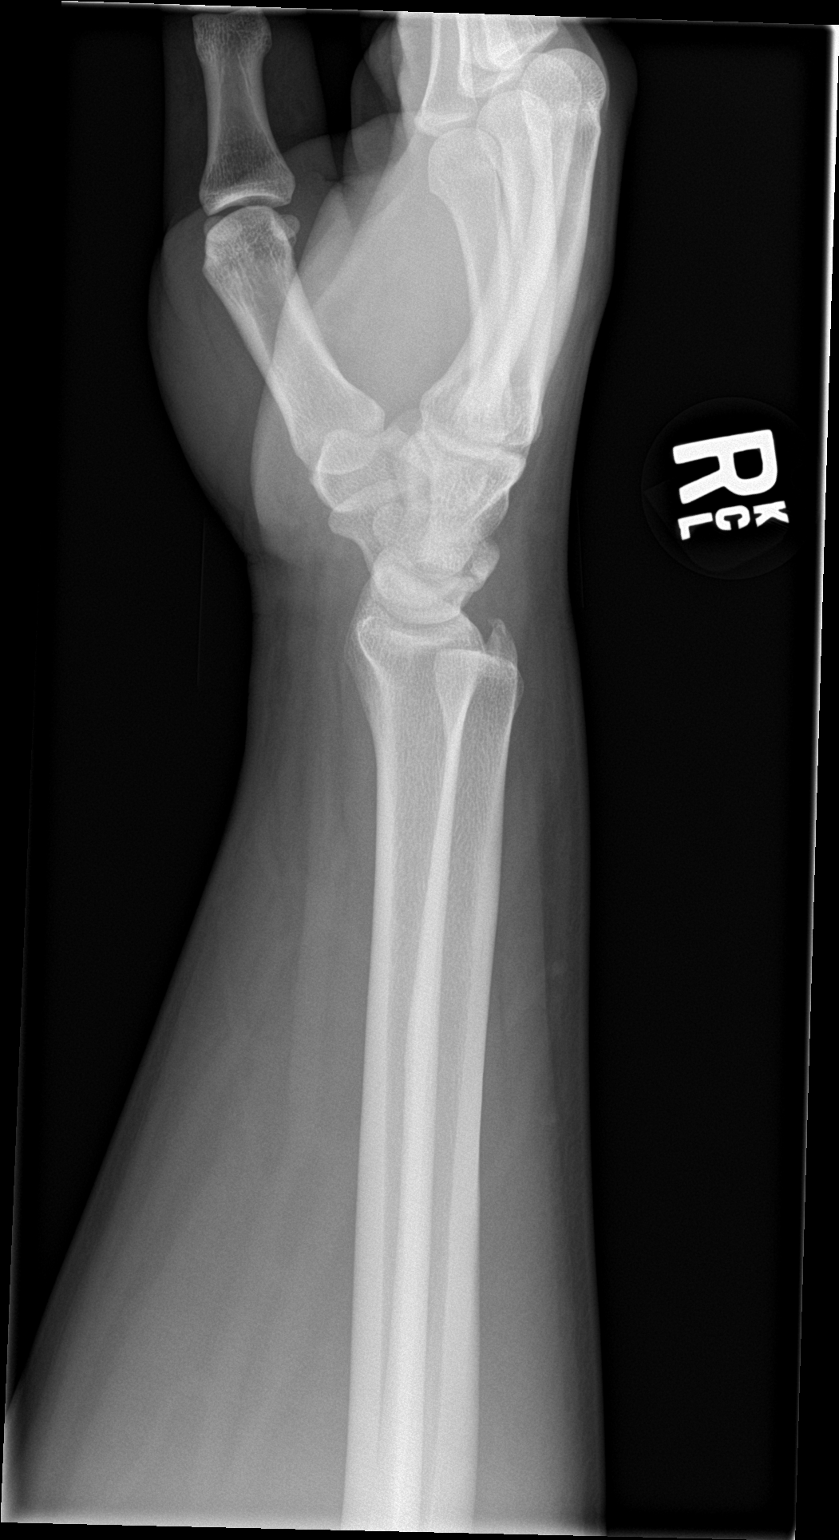

[wrist ap (2 of 2)]
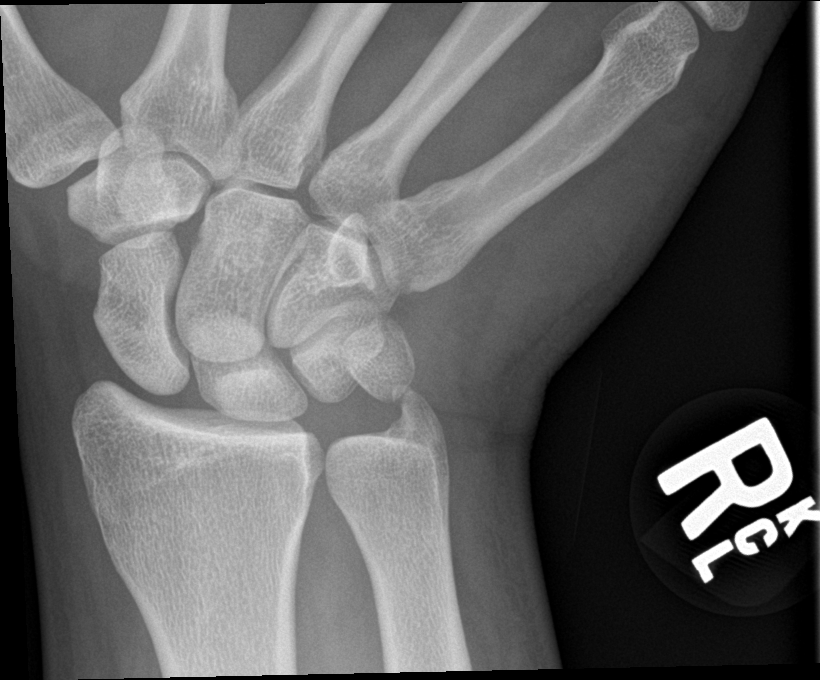

[4 of 4 positions shown; findings below may reference images not displayed]

FINDINGS: Small avulsion fracture of the ulnar styloid. No additional
fracture. No dislocation. Joint spaces are preserved. Bone
mineralization is normal. Soft tissues are unremarkable.
IMPRESSION: 1. Small avulsion fracture of the ulnar styloid.
